# Patient Record
Sex: Female | Born: 1960 | Hispanic: No | Marital: Married | Smoking: Former smoker
Health system: Southern US, Community
[De-identification: ages and names within clinical notes are randomized; demographics above are authoritative.]

## PROBLEM LIST (undated history)

## (undated) DIAGNOSIS — K219 Gastro-esophageal reflux disease without esophagitis: Secondary | ICD-10-CM

## (undated) HISTORY — PX: UPPER GASTROINTESTINAL ENDOSCOPY: SHX188

---

## 1999-02-21 ENCOUNTER — Other Ambulatory Visit: Admission: RE | Admit: 1999-02-21 | Discharge: 1999-02-21 | Payer: Self-pay | Admitting: Obstetrics and Gynecology

## 1999-02-21 ENCOUNTER — Encounter (INDEPENDENT_AMBULATORY_CARE_PROVIDER_SITE_OTHER): Payer: Self-pay | Admitting: Specialist

## 2000-07-14 ENCOUNTER — Other Ambulatory Visit: Admission: RE | Admit: 2000-07-14 | Discharge: 2000-07-14 | Payer: Self-pay | Admitting: Obstetrics and Gynecology

## 2001-10-26 ENCOUNTER — Other Ambulatory Visit: Admission: RE | Admit: 2001-10-26 | Discharge: 2001-10-26 | Payer: Self-pay | Admitting: Obstetrics and Gynecology

## 2002-12-06 ENCOUNTER — Other Ambulatory Visit: Admission: RE | Admit: 2002-12-06 | Discharge: 2002-12-06 | Payer: Self-pay | Admitting: *Deleted

## 2004-06-18 ENCOUNTER — Other Ambulatory Visit: Admission: RE | Admit: 2004-06-18 | Discharge: 2004-06-18 | Payer: Self-pay | Admitting: Obstetrics and Gynecology

## 2005-07-04 ENCOUNTER — Other Ambulatory Visit: Admission: RE | Admit: 2005-07-04 | Discharge: 2005-07-04 | Payer: Self-pay | Admitting: Obstetrics and Gynecology

## 2006-10-20 ENCOUNTER — Other Ambulatory Visit: Admission: RE | Admit: 2006-10-20 | Discharge: 2006-10-20 | Payer: Self-pay | Admitting: Obstetrics and Gynecology

## 2008-04-21 ENCOUNTER — Other Ambulatory Visit: Admission: RE | Admit: 2008-04-21 | Discharge: 2008-04-21 | Payer: Self-pay | Admitting: Gynecology

## 2008-04-21 ENCOUNTER — Encounter: Payer: Self-pay | Admitting: Women's Health

## 2008-04-21 ENCOUNTER — Ambulatory Visit: Payer: Self-pay | Admitting: Women's Health

## 2009-04-11 ENCOUNTER — Other Ambulatory Visit: Admission: RE | Admit: 2009-04-11 | Discharge: 2009-04-11 | Payer: Self-pay | Admitting: Family Medicine

## 2015-08-23 ENCOUNTER — Emergency Department (HOSPITAL_COMMUNITY): Payer: BLUE CROSS/BLUE SHIELD

## 2015-08-23 ENCOUNTER — Encounter (HOSPITAL_COMMUNITY): Payer: Self-pay

## 2015-08-23 ENCOUNTER — Emergency Department (HOSPITAL_COMMUNITY)
Admission: EM | Admit: 2015-08-23 | Discharge: 2015-08-23 | Disposition: A | Discharge status: Home / Self Care | Payer: BLUE CROSS/BLUE SHIELD | Attending: Emergency Medicine | Admitting: Emergency Medicine

## 2015-08-23 DIAGNOSIS — R079 Chest pain, unspecified: Secondary | ICD-10-CM | POA: Diagnosis not present

## 2015-08-23 DIAGNOSIS — Z87891 Personal history of nicotine dependence: Secondary | ICD-10-CM | POA: Insufficient documentation

## 2015-08-23 DIAGNOSIS — K219 Gastro-esophageal reflux disease without esophagitis: Secondary | ICD-10-CM | POA: Diagnosis not present

## 2015-08-23 HISTORY — DX: Gastro-esophageal reflux disease without esophagitis: K21.9

## 2015-08-23 LAB — CBC
HEMATOCRIT: 42.4 % (ref 36.0–46.0)
HEMOGLOBIN: 14.4 g/dL (ref 12.0–15.0)
MCH: 29 pg (ref 26.0–34.0)
MCHC: 34 g/dL (ref 30.0–36.0)
MCV: 85.5 fL (ref 78.0–100.0)
PLATELETS: 214 10*3/uL (ref 150–400)
RBC: 4.96 MIL/uL (ref 3.87–5.11)
RDW: 13.6 % (ref 11.5–15.5)
WBC: 7.1 10*3/uL (ref 4.0–10.5)

## 2015-08-23 LAB — BASIC METABOLIC PANEL
ANION GAP: 10 (ref 5–15)
BUN: 15 mg/dL (ref 6–20)
CO2: 25 mmol/L (ref 22–32)
Calcium: 9.8 mg/dL (ref 8.9–10.3)
Chloride: 105 mmol/L (ref 101–111)
Creatinine, Ser: 0.78 mg/dL (ref 0.44–1.00)
GFR calc Af Amer: >60 mL/min (ref >60)
GFR calc non Af Amer: >60 mL/min (ref >60)
GLUCOSE: 83 mg/dL (ref 65–99)
POTASSIUM: 3.7 mmol/L (ref 3.5–5.1)
Sodium: 140 mmol/L (ref 135–145)

## 2015-08-23 LAB — I-STAT TROPONIN, ED: Troponin i, poc: 0 ng/mL (ref 0.00–0.08)

## 2015-08-23 MED ORDER — GI COCKTAIL ~~LOC~~
30.0000 mL | Freq: Once | ORAL | Status: AC
  Administered 2015-08-23: 30 mL via ORAL
  Filled 2015-08-23: qty 30

## 2015-08-23 MED ORDER — PANTOPRAZOLE SODIUM 20 MG PO TBEC: 20.0000 mg | DELAYED_RELEASE_TABLET | Freq: Every day | ORAL | Status: DC

## 2015-08-23 MED ORDER — FAMOTIDINE 20 MG PO TABS
20.0000 mg | ORAL_TABLET | Freq: Once | ORAL | Status: AC
  Administered 2015-08-23: 20 mg via ORAL
  Filled 2015-08-23: qty 1

## 2015-08-23 MED ORDER — PANTOPRAZOLE SODIUM 40 MG PO TBEC
40.0000 mg | DELAYED_RELEASE_TABLET | Freq: Once | ORAL | Status: AC
  Administered 2015-08-23: 40 mg via ORAL
  Filled 2015-08-23: qty 1

## 2015-08-23 NOTE — ED Provider Notes (Signed)
CSN: [id number]     Arrival date & time 08/23/15  1738 History   First MD Initiated Contact with Patient 08/23/15 1941     Chief Complaint  Patient presents with  . Chest Pain     (Consider location/radiation/quality/duration/timing/severity/associated sxs/prior Treatment) HPI Patient awoke with chest pain this morning. She reports is in the center of her chest and comes up to her upper back. As been constant throughout the day. Patient has had similar pain occasionally. It is usually upon waking in the morning. Usually goes away once she is up. He does note this pain is worse with lying supine. A and takes Tums as needed. She has however had some significant problems with GERD and required esophageal dilation. Lower extremity pain or swelling. Patient does not smoke or drink alcohol. Mother had early onset of heart disease in her 44s or 68s. She did not die of heart disease. Patient has no history of hypertension or diabetes. Past Medical History  Diagnosis Date  . GERD (gastroesophageal reflux disease)    History reviewed. No pertinent past surgical history. History reviewed. No pertinent family history. Social History  Substance Use Topics  . Smoking status: Former Research scientist (life sciences)  . Smokeless tobacco: None  . Alcohol Use: No   OB History    No data available     Review of Systems  10 Systems reviewed and are negative for acute change except as noted in the HPI.   Allergies  Review of patient's allergies indicates no known allergies.  Home Medications   Prior to Admission medications   Medication Sig Start Date End Date Taking? Authorizing Provider  calcium carbonate (TUMS - DOSED IN MG ELEMENTAL CALCIUM) 500 MG chewable tablet Chew 1 tablet by mouth daily as needed for indigestion or heartburn (chest pain).   Yes Historical Provider, MD  ibuprofen (ADVIL,MOTRIN) 200 MG tablet Take 200 mg by mouth every 6 (six) hours as needed (chest pain).   Yes Historical Provider, MD   pantoprazole (PROTONIX) 20 MG tablet Take 1 tablet (20 mg total) by mouth daily. 08/23/15   Charlesetta Shanks, MD   BP 126/61 mmHg  Pulse 64  Temp(Src) 98 F (36.7 C) (Oral)  Resp 18  SpO2 98% Physical Exam  Constitutional: She is oriented to person, place, and time. She appears well-developed and well-nourished.  HENT:  Head: Normocephalic and atraumatic.  Eyes: EOM are normal. Pupils are equal, round, and reactive to light.  Neck: Neck supple.  Cardiovascular: Normal rate, regular rhythm, normal heart sounds and intact distal pulses.   Pulmonary/Chest: Effort normal and breath sounds normal.  Abdominal: Soft. Bowel sounds are normal. She exhibits no distension. There is no tenderness.  Musculoskeletal: Normal range of motion. She exhibits no edema or tenderness.  Neurological: She is alert and oriented to person, place, and time. She has normal strength. Coordination normal. GCS eye subscore is 4. GCS verbal subscore is 5. GCS motor subscore is 6.  Skin: Skin is warm, dry and intact.  Psychiatric: She has a normal mood and affect.    ED Course  Procedures (including critical care time) Labs Review Labs Reviewed  BASIC METABOLIC PANEL  CBC  I-STAT [place], ED    Imaging Review Dg Chest 2 View  08/23/2015  CLINICAL DATA:  Chest pain began today. EXAM: CHEST  2 VIEW COMPARISON:  None. FINDINGS: Borderline prominence of upper zone pulmonary vasculature but no enlargement of the cardiopericardial silhouette to further suggest pulmonary venous hypertension. The lungs appear otherwise clear.  No pleural effusion. IMPRESSION: 1.  No significant abnormality identified. Electronically Signed   By: Van Clines M.D.   On: 08/23/2015 19:08   I have personally reviewed and evaluated these images and lab results as part of my medical decision-making.   EKG Interpretation   Date/Time:  Wednesday August 23 2015 17:57:15 EDT Ventricular Rate:  65 PR Interval:  122 QRS Duration: 84 QT  Interval:  387 QTC Calculation: 402 R Axis:   64 Text Interpretation:  Sinus rhythm Probable anteroseptal infarct, old poor  r wave progression Confirmed by Johnney Killian, MD, Jeannie Done [phone]) on 08/23/2015  8:39:22 PM      MDM   Final diagnoses:  Chest pain, unspecified chest pain type   A presents with chest pain has been present all day. At this time symptoms are suggestive of reflux. She awakened with the discomfort and it is worsened by lying supine. Physical examination is normal. There is no associated symptoms. Patient does have a significant history of reflux and having had esophageal dilation. She has minimal risk factors aside from family history. Patient will be started on daily Protonix with recommendations for follow-up with cardiology as an outpatient. She is also counseled to establish care with a primary provider. Signs and symptoms for which to return are reviewed    Charlesetta Shanks, MD 08/23/15 2100

## 2015-08-23 NOTE — ED Notes (Signed)
Pt with chest pain starting when waking up this morning.  Comes and goes.  At times in upper back.  Pt denies cough/congestion.  No recent increase in physical activity.  HX of GERD but only takes 1. Took 1 tums today with some slight relief and some relief with tylenol.

## 2015-08-23 NOTE — Discharge Instructions (Signed)
Nonspecific Chest Pain  °Chest pain can be caused by many different conditions. There is always a chance that your pain could be related to something serious, such as a heart attack or a blood clot in your lungs. Chest pain can also be caused by conditions that are not life-threatening. If you have chest pain, it is very important to follow up with your health care provider. °CAUSES  °Chest pain can be caused by: °· Heartburn. °· Pneumonia or bronchitis. °· Anxiety or stress. °· Inflammation around your heart (pericarditis) or lung (pleuritis or pleurisy). °· A blood clot in your lung. °· A collapsed lung (pneumothorax). It can develop suddenly on its own (spontaneous pneumothorax) or from trauma to the chest. °· Shingles infection (varicella-zoster virus). °· Heart attack. °· Damage to the bones, muscles, and cartilage that make up your chest wall. This can include: °· Bruised bones due to injury. °· Strained muscles or cartilage due to frequent or repeated coughing or overwork. °· Fracture to one or more ribs. °· Sore cartilage due to inflammation (costochondritis). °RISK FACTORS  °Risk factors for chest pain may include: °· Activities that increase your risk for trauma or injury to your chest. °· Respiratory infections or conditions that cause frequent coughing. °· Medical conditions or overeating that can cause heartburn. °· Heart disease or family history of heart disease. °· Conditions or health behaviors that increase your risk of developing a blood clot. °· Having had chicken pox (varicella zoster). °SIGNS AND SYMPTOMS °Chest pain can feel like: °· Burning or tingling on the surface of your chest or deep in your chest. °· Crushing, pressure, aching, or squeezing pain. °· Dull or sharp pain that is worse when you move, cough, or take a deep breath. °· Pain that is also felt in your back, neck, shoulder, or arm, or pain that spreads to any of these areas. °Your chest pain may come and go, or it may stay  constant. °DIAGNOSIS °Lab tests or other studies may be needed to find the cause of your pain. Your health care provider may have you take a test called an ambulatory ECG (electrocardiogram). An ECG records your heartbeat patterns at the time the test is performed. You may also have other tests, such as: °· Transthoracic echocardiogram (TTE). During echocardiography, sound waves are used to create a picture of all of the heart structures and to look at how blood flows through your heart. °· Transesophageal echocardiogram (TEE). This is a more advanced imaging test that obtains images from inside your body. It allows your health care provider to see your heart in finer detail. °· Cardiac monitoring. This allows your health care provider to monitor your heart rate and rhythm in real time. °· Holter monitor. This is a portable device that records your heartbeat and can help to diagnose abnormal heartbeats. It allows your health care provider to track your heart activity for several days, if needed. °· Stress tests. These can be done through exercise or by taking medicine that makes your heart beat more quickly. °· Blood tests. °· Imaging tests. °TREATMENT  °Your treatment depends on what is causing your chest pain. Treatment may include: °· Medicines. These may include: °· Acid blockers for heartburn. °· Anti-inflammatory medicine. °· Pain medicine for inflammatory conditions. °· Antibiotic medicine, if an infection is present. °· Medicines to dissolve blood clots. °· Medicines to treat coronary artery disease. °· Supportive care for conditions that do not require medicines. This may include: °· Resting. °· Applying heat   or cold packs to injured areas. °· Limiting activities until pain decreases. °HOME CARE INSTRUCTIONS °· If you were prescribed an antibiotic medicine, finish it all even if you start to feel better. °· Avoid any activities that bring on chest pain. °· Do not use any tobacco products, including  cigarettes, chewing tobacco, or electronic cigarettes. If you need help quitting, ask your health care provider. °· Do not drink alcohol. °· Take medicines only as directed by your health care provider. °· Keep all follow-up visits as directed by your health care provider. This is important. This includes any further testing if your chest pain does not go away. °· If heartburn is the cause for your chest pain, you may be told to keep your head raised (elevated) while sleeping. This reduces the chance that acid will go from your stomach into your esophagus. °· Make lifestyle changes as directed by your health care provider. These may include: °¨ Getting regular exercise. Ask your health care provider to suggest some activities that are safe for you. °¨ Eating a heart-healthy diet. A registered dietitian can help you to learn healthy eating options. °¨ Maintaining a healthy weight. °¨ Managing diabetes, if necessary. °¨ Reducing stress. °SEEK MEDICAL CARE IF: °· Your chest pain does not go away after treatment. °· You have a rash with blisters on your chest. °· You have a fever. °SEEK IMMEDIATE MEDICAL CARE IF:  °· Your chest pain is worse. °· You have an increasing cough, or you cough up blood. °· You have severe abdominal pain. °· You have severe weakness. °· You faint. °· You have chills. °· You have sudden, unexplained chest discomfort. °· You have sudden, unexplained discomfort in your arms, back, neck, or jaw. °· You have shortness of breath at any time. °· You suddenly start to sweat, or your skin gets clammy. °· You feel nauseous or you vomit. °· You suddenly feel light-headed or dizzy. °· Your heart begins to beat quickly, or it feels like it is skipping beats. °These symptoms may represent a serious problem that is an emergency. Do not wait to see if the symptoms will go away. Get medical help right away. Call your local emergency services (911 in the [place] Do not drive yourself to the hospital. °  °This  information is not intended to replace advice given to you by your health care provider. Make sure you discuss any questions you have with your health care provider. °  °Document Released: 02/13/2005 Document Revised: 05/27/2014 Document Reviewed: 12/10/2013 °Elsevier Interactive Patient Education ©2016 [employer] °Suspected Gastroesophageal Reflux Disease, Adult °Normally, food travels down the esophagus and stays in the stomach to be digested. However, when a person has gastroesophageal reflux disease (GERD), food and stomach acid move back up into the esophagus. When this happens, the esophagus becomes sore and inflamed. Over time, GERD can create small holes (ulcers) in the lining of the esophagus.  °CAUSES °This condition is caused by a problem with the muscle between the esophagus and the stomach (lower esophageal sphincter, or LES). Normally, the LES muscle closes after food passes through the esophagus to the stomach. When the LES is weakened or abnormal, it does not close properly, and that allows food and stomach acid to go back up into the esophagus. The LES can be weakened by certain dietary substances, medicines, and medical conditions, including: °· Tobacco use. °· Pregnancy. °· Having a hiatal hernia. °· Heavy alcohol use. °· Certain foods and beverages, such as coffee,   chocolate, onions, and peppermint. °RISK FACTORS °This condition is more likely to develop in: °· People who have an increased body weight. °· People who have connective tissue disorders. °· People who use NSAID medicines. °SYMPTOMS °Symptoms of this condition include: °· Heartburn. °· Difficult or painful swallowing. °· The feeling of having a lump in the throat. °· A bitter taste in the mouth. °· Bad breath. °· Having a large amount of saliva. °· Having an upset or bloated stomach. °· Belching. °· Chest pain. °· Shortness of breath or wheezing. °· Ongoing (chronic) cough or a night-time cough. °· Wearing away of tooth  enamel. °· Weight loss. °Different conditions can cause chest pain. Make sure to see your health care provider if you experience chest pain. °DIAGNOSIS °Your health care provider will take a medical history and perform a physical exam. To determine if you have mild or severe GERD, your health care provider may also monitor how you respond to treatment. You may also have other tests, including: °· An endoscopy to examine your stomach and esophagus with a small camera. °· A test that measures the acidity level in your esophagus. °· A test that measures how much pressure is on your esophagus. °· A barium swallow or modified barium swallow to show the shape, size, and functioning of your esophagus. °TREATMENT °The goal of treatment is to help relieve your symptoms and to prevent complications. Treatment for this condition may vary depending on how severe your symptoms are. Your health care provider may recommend: °· Changes to your diet. °· Medicine. °· Surgery. °HOME CARE INSTRUCTIONS °Diet °· Follow a diet as recommended by your health care provider. This may involve avoiding foods and drinks such as: °¨ Coffee and tea (with or without caffeine). °¨ Drinks that contain alcohol. °¨ Energy drinks and sports drinks. °¨ Carbonated drinks or sodas. °¨ Chocolate and cocoa. °¨ Peppermint and mint flavorings. °¨ Garlic and onions. °¨ Horseradish. °¨ Spicy and acidic foods, including peppers, chili powder, curry powder, vinegar, hot sauces, and barbecue sauce. °¨ Citrus fruit juices and citrus fruits, such as oranges, lemons, and limes. °¨ Tomato-based foods, such as red sauce, chili, salsa, and pizza with red sauce. °¨ Fried and fatty foods, such as donuts, french fries, potato chips, and high-fat dressings. °¨ High-fat meats, such as hot dogs and fatty cuts of red and white meats, such as rib eye steak, sausage, ham, and bacon. °¨ High-fat dairy items, such as whole milk, butter, and cream cheese. °· Eat small, frequent  meals instead of large meals. °· Avoid drinking large amounts of liquid with your meals. °· Avoid eating meals during the 2-3 hours before bedtime. °· Avoid lying down right after you eat. °· Do not exercise right after you eat. ° General Instructions  °· Pay attention to any changes in your symptoms. °· Take over-the-counter and prescription medicines only as told by your health care provider. Do not take aspirin, ibuprofen, or other NSAIDs unless your health care provider told you to do so. °· Do not use any tobacco products, including cigarettes, chewing tobacco, and e-cigarettes. If you need help quitting, ask your health care provider. °· Wear loose-fitting clothing. Do not wear anything tight around your waist that causes pressure on your abdomen. °· Raise (elevate) the head of your bed 6 inches (15cm). °· Try to reduce your stress, such as with yoga or meditation. If you need help reducing stress, ask your health care provider. °· If you are overweight, reduce your weight to an amount that is   healthy for you. Ask your health care provider for guidance about a safe weight loss goal. °· Keep all follow-up visits as told by your health care provider. This is important. °SEEK MEDICAL CARE IF: °· You have new symptoms. °· You have unexplained weight loss. °· You have difficulty swallowing, or it hurts to swallow. °· You have wheezing or a persistent cough. °· Your symptoms do not improve with treatment. °· You have a hoarse voice. °SEEK IMMEDIATE MEDICAL CARE IF: °· You have pain in your arms, neck, jaw, teeth, or back. °· You feel sweaty, dizzy, or light-headed. °· You have chest pain or shortness of breath. °· You vomit and your vomit looks like blood or coffee grounds. °· You faint. °· Your stool is bloody or black. °· You cannot swallow, drink, or eat. °  °This information is not intended to replace advice given to you by your health care provider. Make sure you discuss any questions you have with your health  care provider. °  °Document Released: 02/13/2005 Document Revised: 01/25/2015 Document Reviewed: 08/31/2014 °Elsevier Interactive Patient Education ©2016 [employer] ° °

## 2019-07-01 ENCOUNTER — Encounter: Payer: Self-pay | Admitting: Family Medicine

## 2020-08-04 ENCOUNTER — Encounter: Payer: Self-pay | Admitting: Gastroenterology

## 2020-09-07 ENCOUNTER — Other Ambulatory Visit: Payer: Self-pay | Admitting: Obstetrics & Gynecology

## 2020-09-07 DIAGNOSIS — R928 Other abnormal and inconclusive findings on diagnostic imaging of breast: Secondary | ICD-10-CM

## 2020-09-22 ENCOUNTER — Encounter: Payer: BLUE CROSS/BLUE SHIELD | Admitting: Gastroenterology

## 2020-09-29 ENCOUNTER — Other Ambulatory Visit: Payer: Self-pay

## 2020-09-29 ENCOUNTER — Ambulatory Visit
Admission: RE | Admit: 2020-09-29 | Discharge: 2020-09-29 | Disposition: A | Discharge status: Home / Self Care | Payer: PRIVATE HEALTH INSURANCE | Source: Ambulatory Visit | Attending: Obstetrics & Gynecology | Admitting: Obstetrics & Gynecology

## 2020-09-29 DIAGNOSIS — R928 Other abnormal and inconclusive findings on diagnostic imaging of breast: Secondary | ICD-10-CM

## 2020-11-03 ENCOUNTER — Ambulatory Visit (AMBULATORY_SURGERY_CENTER): Payer: BC Managed Care – PPO | Admitting: *Deleted

## 2020-11-03 ENCOUNTER — Other Ambulatory Visit: Payer: Self-pay

## 2020-11-03 VITALS — Ht 65.0 in | Wt 168.0 lb

## 2020-11-03 DIAGNOSIS — Z1211 Encounter for screening for malignant neoplasm of colon: Secondary | ICD-10-CM

## 2020-11-03 NOTE — Progress Notes (Signed)
Patient's pre-visit was done today over the phone with the patient due to COVID-19 pandemic. Name,DOB and address verified. Insurance verified. Patient denies any allergies to Eggs and Soy. Patient denies any problems with sedation. Patient denies taking diet pills or blood thinners. Packet of Prep instructions mailed to patient including a copy of a consent form-pt is aware. Patient understands to call us back with any questions or concerns. Patient is aware of our care-partner policy and [facility] safety protocol. The patient is COVID-19 vaccinated, per patient.

## 2020-11-15 ENCOUNTER — Encounter: Payer: Self-pay | Admitting: Gastroenterology

## 2020-11-17 ENCOUNTER — Other Ambulatory Visit: Payer: Self-pay

## 2020-11-17 ENCOUNTER — Encounter: Payer: Self-pay | Admitting: Gastroenterology

## 2020-11-17 ENCOUNTER — Ambulatory Visit (AMBULATORY_SURGERY_CENTER): Payer: BC Managed Care – PPO | Admitting: Gastroenterology

## 2020-11-17 VITALS — BP 133/71 | HR 74 | Temp 97.8°F | Resp 16 | Ht 65.0 in | Wt 168.0 lb

## 2020-11-17 DIAGNOSIS — K635 Polyp of colon: Secondary | ICD-10-CM

## 2020-11-17 DIAGNOSIS — K621 Rectal polyp: Secondary | ICD-10-CM

## 2020-11-17 DIAGNOSIS — D125 Benign neoplasm of sigmoid colon: Secondary | ICD-10-CM

## 2020-11-17 DIAGNOSIS — Z1211 Encounter for screening for malignant neoplasm of colon: Secondary | ICD-10-CM | POA: Diagnosis present

## 2020-11-17 DIAGNOSIS — D128 Benign neoplasm of rectum: Secondary | ICD-10-CM

## 2020-11-17 DIAGNOSIS — D127 Benign neoplasm of rectosigmoid junction: Secondary | ICD-10-CM

## 2020-11-17 MED ORDER — SODIUM CHLORIDE 0.9 % IV SOLN: 500.0000 mL | Freq: Once | INTRAVENOUS | Status: DC

## 2020-11-17 NOTE — Progress Notes (Signed)
[facility]  Pt's states no medical or surgical changes since previsit or office visit.  

## 2020-11-17 NOTE — Op Note (Signed)
[facility] Patient Name: Heather Huber Procedure Date: 11/17/2020 10:12 AM MRN: [id number] Endoscopist: Milus Banister , MD Age: 60 Referring MD:  Date of Birth: Oct 31, 1960 Gender: Female Account #: [id number] Procedure:                Colonoscopy Indications:              Screening for colorectal malignant neoplasm Medicines:                Monitored Anesthesia Care Procedure:                Pre-Anesthesia Assessment:                           - Prior to the procedure, a History and Physical                            was performed, and patient medications and                            allergies were reviewed. The patient's tolerance of                            previous anesthesia was also reviewed. The risks                            and benefits of the procedure and the sedation                            options and risks were discussed with the patient.                            All questions were answered, and informed consent                            was obtained. Prior Anticoagulants: The patient has                            taken no previous anticoagulant or antiplatelet                            agents. ASA Grade Assessment: II - A patient with                            mild systemic disease. After reviewing the risks                            and benefits, the patient was deemed in                            satisfactory condition to undergo the procedure.                           After obtaining informed consent, the colonoscope  was passed under direct vision. Throughout the                            procedure, the patient's blood pressure, pulse, and                            oxygen saturations were monitored continuously. The                            CF [id number] #[id number] was introduced through the anus                            and advanced to the the cecum, identified by                            appendiceal orifice  and ileocecal valve. The                            colonoscopy was performed without difficulty. The                            patient tolerated the procedure well. The quality                            of the bowel preparation was good. The ileocecal                            valve, appendiceal orifice, and rectum were                            photographed. Scope In: 10:32:13 AM Scope Out: 10:43:06 AM Scope Withdrawal Time: 0 hours 8 minutes 30 seconds  Total Procedure Duration: 0 hours 10 minutes 53 seconds  Findings:                 Two sessile polyps were found in the rectum and                            sigmoid colon. The polyps were 2 to 3 mm in size.                            These polyps were removed with a cold snare.                            Resection and retrieval were complete.                           The exam was otherwise without abnormality on                            direct and retroflexion views. Complications:            No immediate complications. Estimated blood loss:  None. Estimated Blood Loss:     Estimated blood loss: none. Impression:               - Two 2 to 3 mm polyps in the rectum and in the                            sigmoid colon, removed with a cold snare. Resected                            and retrieved.                           - The examination was otherwise normal on direct                            and retroflexion views. Recommendation:           - Patient has a contact number available for                            emergencies. The signs and symptoms of potential                            delayed complications were discussed with the                            patient. Return to normal activities tomorrow.                            Written discharge instructions were provided to the                            patient.                           - Resume previous diet.                           -  Continue present medications.                           - Await pathology results. Milus Banister, MD 11/17/2020 10:50:54 AM This report has been signed electronically.

## 2020-11-17 NOTE — Progress Notes (Signed)
Report to PACU, RN, vss, BBS= Clear.  

## 2020-11-17 NOTE — Progress Notes (Signed)
Called to room to assist during endoscopic procedure.  Patient ID and intended procedure confirmed with present staff. Received instructions for my participation in the procedure from the performing physician.  

## 2020-11-17 NOTE — Patient Instructions (Signed)
YOU HAD AN ENDOSCOPIC PROCEDURE TODAY AT THE [facility]:   Refer to the procedure report that was given to you for any specific questions about what was found during the examination.  If the procedure report does not answer your questions, please call your gastroenterologist to clarify.  If you requested that your care partner not be given the details of your procedure findings, then the procedure report has been included in a sealed envelope for you to review at your convenience later.  YOU SHOULD EXPECT: Some feelings of bloating in the abdomen. Passage of more gas than usual.  Walking can help get rid of the air that was put into your GI tract during the procedure and reduce the bloating. If you had a lower endoscopy (such as a colonoscopy or flexible sigmoidoscopy) you may notice spotting of blood in your stool or on the toilet paper. If you underwent a bowel prep for your procedure, you may not have a normal bowel movement for a few days.  Please Note:  You might notice some irritation and congestion in your nose or some drainage.  This is from the oxygen used during your procedure.  There is no need for concern and it should clear up in a day or so.  SYMPTOMS TO REPORT IMMEDIATELY:  Following lower endoscopy (colonoscopy or flexible sigmoidoscopy):  Excessive amounts of blood in the stool  Significant tenderness or worsening of abdominal pains  Swelling of the abdomen that is new, acute  Fever of 100F or higher    For urgent or emergent issues, a gastroenterologist can be reached at any hour by calling [phone]. Do not use MyChart messaging for urgent concerns.    DIET:  We do recommend a small meal at first, but then you may proceed to your regular diet.  Drink plenty of fluids but you should avoid alcoholic beverages for 24 hours.  ACTIVITY:  You should plan to take it easy for the rest of today and you should NOT DRIVE or use heavy machinery until tomorrow (because  of the sedation medicines used during the test).    FOLLOW UP: Our staff will call the number listed on your records 48-72 hours following your procedure to check on you and address any questions or concerns that you may have regarding the information given to you following your procedure. If we do not reach you, we will leave a message.  We will attempt to reach you two times.  During this call, we will ask if you have developed any symptoms of COVID 19. If you develop any symptoms (ie: fever, flu-like symptoms, shortness of breath, cough etc.) before then, please call [phone].  If you test positive for Covid 19 in the 2 weeks post procedure, please call and report this information to us.    If any biopsies were taken you will be contacted by phone or by letter within the next 1-3 weeks.  Please call us at [phone] if you have not heard about the biopsies in 3 weeks.    SIGNATURES/CONFIDENTIALITY: You and/or your care partner have signed paperwork which will be entered into your electronic medical record.  These signatures attest to the fact that that the information above on your After Visit Summary has been reviewed and is understood.  Full responsibility of the confidentiality of this discharge information lies with you and/or your care-partner.    Resume medications. Information given on polyps. 

## 2020-11-22 ENCOUNTER — Telehealth: Payer: Self-pay

## 2020-11-22 NOTE — Telephone Encounter (Signed)
Left message on answering machine. 

## 2020-11-27 ENCOUNTER — Encounter: Payer: Self-pay | Admitting: Gastroenterology

## 2022-01-01 IMAGING — MG MM DIGITAL DIAGNOSTIC UNILAT*L* W/ TOMO W/ CAD
4 series · 4 of 12 positions shown · non-contrast
Comparison: Previous exam(s).

CLINICAL DATA: Screening recall for a possible left breast mass.

EXAM:
DIGITAL DIAGNOSTIC UNILATERAL LEFT MAMMOGRAM WITH TOMOSYNTHESIS AND
CAD; ULTRASOUND LEFT BREAST LIMITED
TECHNIQUE: Left digital diagnostic mammography and breast tomosynthesis was
performed. The images were evaluated with computer-aided detection.;
Targeted ultrasound examination of the left breast was performed

[L CC synth-2D]
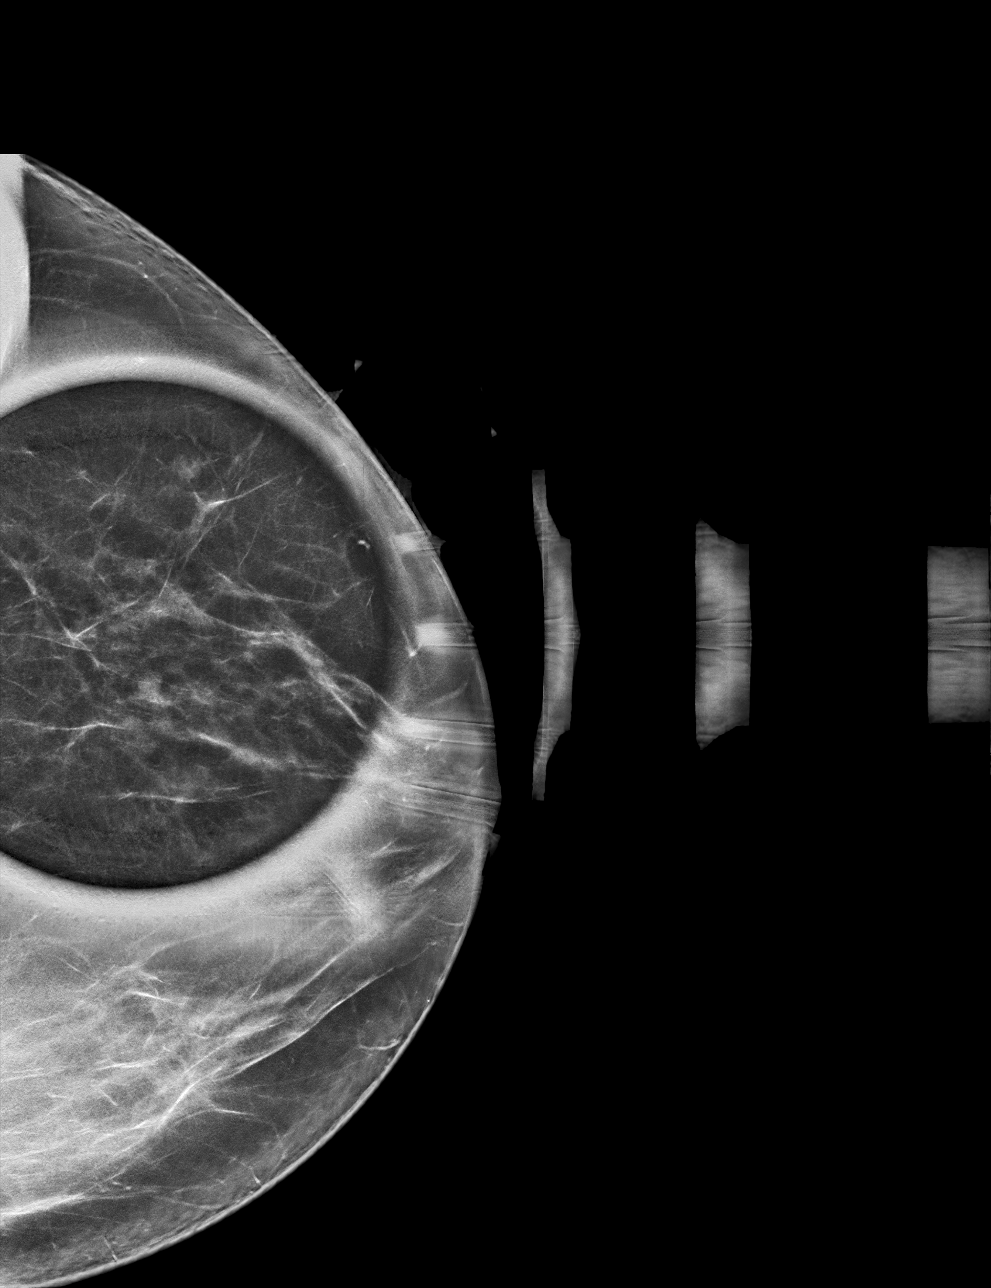

[L MLO synth-2D]
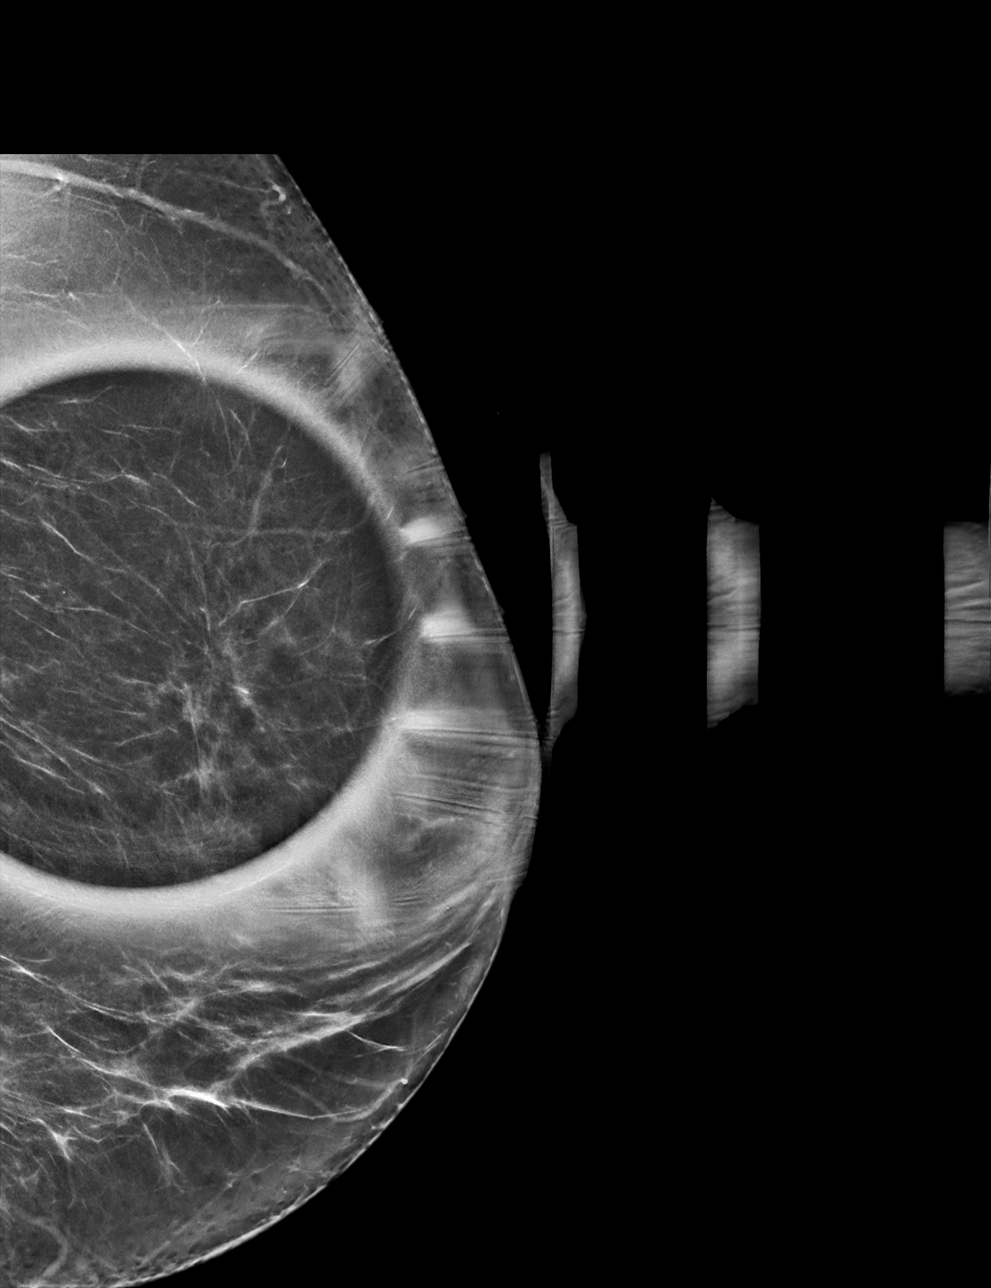

[L MLO tomo · tomo slice 37/73.0]
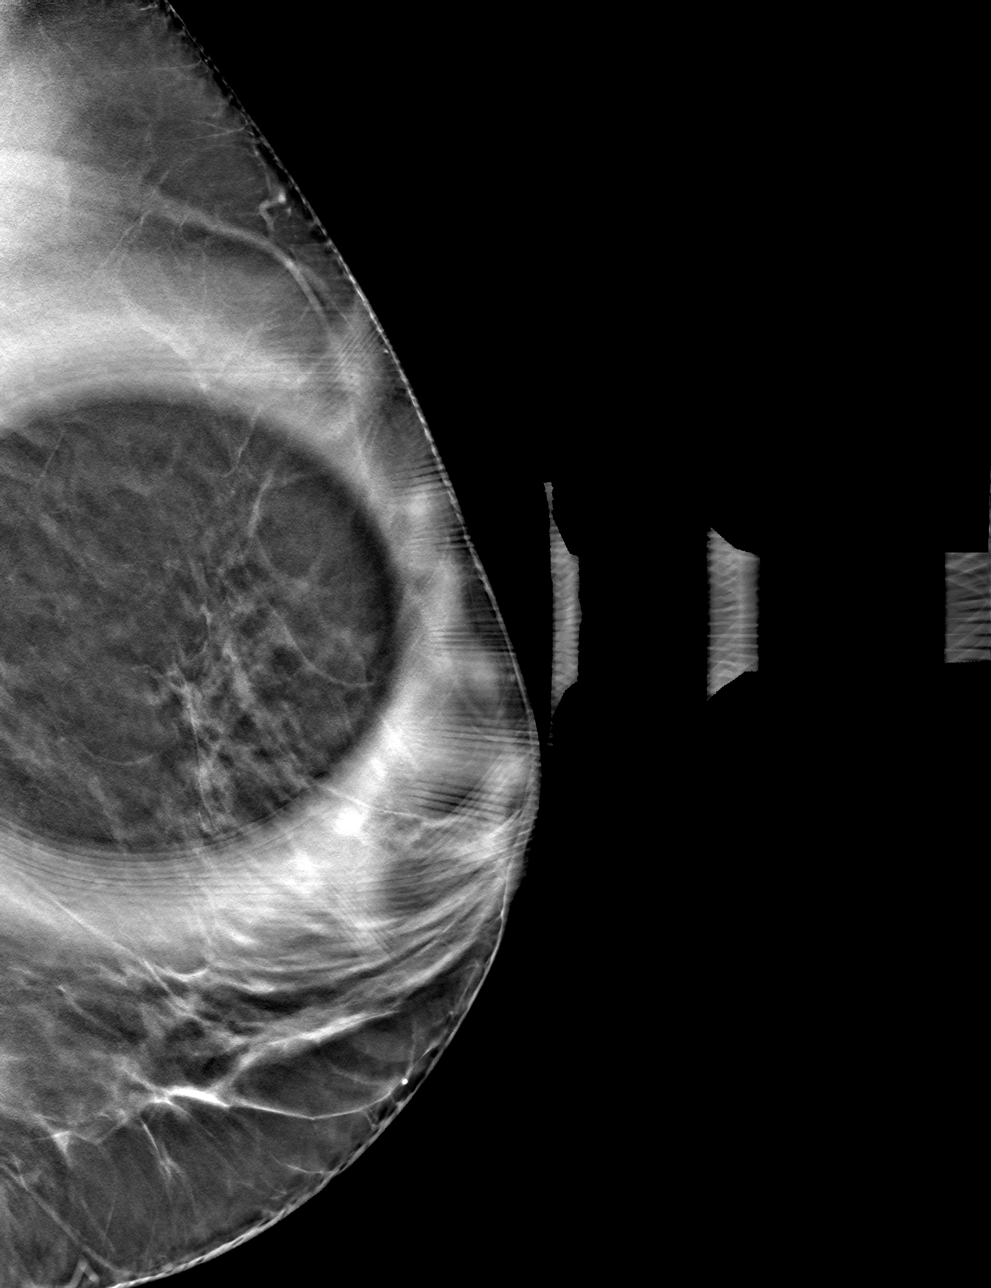

[L CC tomo · tomo slice 33/64.0]
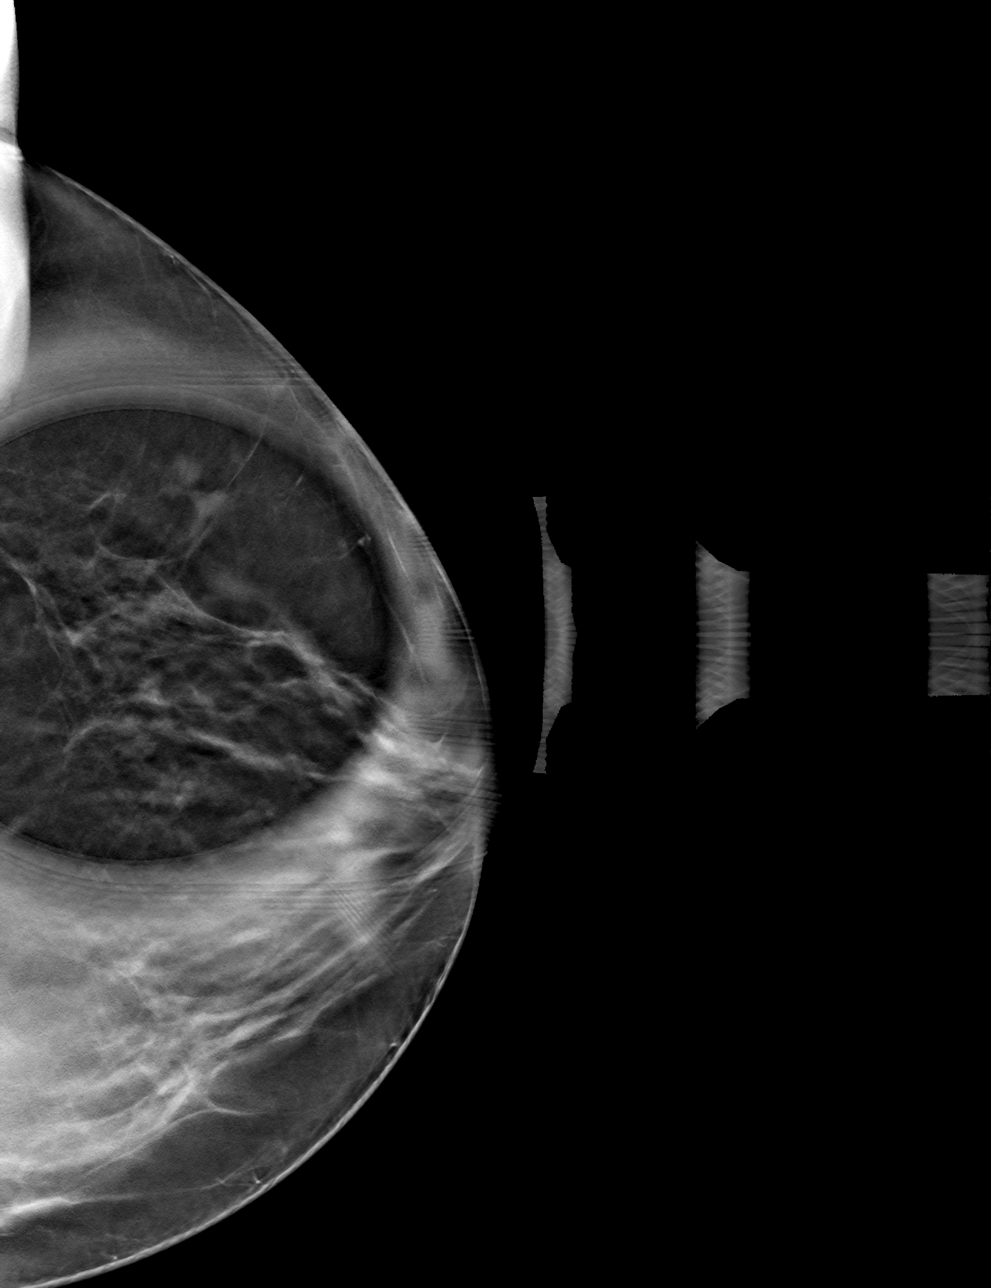

[4 of 12 positions shown; findings below may reference images not displayed]

ACR Breast Density Category b: There are scattered areas of
fibroglandular density.
FINDINGS: Spot compression tomosynthesis images reveal a persistent oval
circumscribed mass in the lateral aspect of the left breast, middle
depth. The mass measures approximately 5 mm.

Ultrasound targeted to the left breast at 3 o'clock demonstrates a
few small benign-appearing cysts. There is a cyst at 3 o'clock, 4 cm
from the nipple measuring 5 x 3 x 4 mm, likely corresponding with
the mass identified mammographically. At 3 o'clock, 1 cm from the
nipple, there is a 4 mm benign-appearing cyst.
IMPRESSION: The mass in the left breast corresponds with a benign cyst.

RECOMMENDATION:
Screening mammogram in one year.(Code:ZF-[id number])

I have discussed the findings and recommendations with the patient.
If applicable, a reminder letter will be sent to the patient
regarding the next appointment.

BI-RADS CATEGORY  2: Benign.

## 2022-09-05 ENCOUNTER — Encounter: Payer: Self-pay | Admitting: Pulmonary Disease

## 2022-09-05 ENCOUNTER — Ambulatory Visit: Payer: No Typology Code available for payment source | Admitting: Pulmonary Disease

## 2022-09-05 VITALS — BP 124/72 | HR 72 | Ht 65.0 in | Wt 172.0 lb

## 2022-09-05 DIAGNOSIS — G478 Other sleep disorders: Secondary | ICD-10-CM

## 2022-09-05 NOTE — Patient Instructions (Signed)
Next step in evaluation will be a sleep study  --This is about the only way that we know whether you have significant sleep apnea or any significant sleep apnea or just snoring  Weight loss, regular exercises will help number of events and help sleep quality as well  Call us with significant concerns  Tentative follow-up in 6 months to reassess  Living With Sleep Apnea Sleep apnea is a condition in which breathing pauses or becomes shallow during sleep. Sleep apnea is most commonly caused by a collapsed or blocked airway. People with sleep apnea usually snore loudly. They may have times when they gasp and stop breathing for 10 seconds or more during sleep. This may happen many times during the night. The breaks in breathing also interrupt the deep sleep that you need to feel rested. Even if you do not completely wake up from the gaps in breathing, your sleep may not be restful and you feel tired during the day. You may also have a headache in the morning and low energy during the day, and you may feel anxious or depressed. How can sleep apnea affect me? Sleep apnea increases your chances of extreme tiredness during the day (daytime fatigue). It can also increase your risk for health conditions, such as: Heart attack. Stroke. Obesity. Type 2 diabetes. Heart failure. Irregular heartbeat. High blood pressure. If you have daytime fatigue as a result of sleep apnea, you may be more likely to: Perform poorly at school or work. Fall asleep while driving. Have difficulty with attention. Develop depression or anxiety. Have sexual dysfunction. What actions can I take to manage sleep apnea? Sleep apnea treatment  If you were given a device to open your airway while you sleep, use it only as told by your health care provider. You may be given: An oral appliance. This is a custom-made mouthpiece that shifts your lower jaw forward. A continuous positive airway pressure (CPAP) device. This device  blows air through a mask when you breathe out (exhale). A nasal expiratory positive airway pressure (EPAP) device. This device has valves that you put into each nostril. A bi-level positive airway pressure (BIPAP) device. This device blows air through a mask when you breathe in (inhale) and breathe out (exhale). You may need surgery if other treatments do not work for you. Sleep habits Go to sleep and wake up at the same time every day. This helps set your internal clock (circadian rhythm) for sleeping. If you stay up later than usual, such as on weekends, try to get up in the morning within 2 hours of your normal wake time. Try to get at least 7-9 hours of sleep each night. Stop using a computer, tablet, and mobile phone a few hours before bedtime. Do not take long naps during the day. If you nap, limit it to 30 minutes. Have a relaxing bedtime routine. Reading or listening to music may relax you and help you sleep. Use your bedroom only for sleep. Keep your television and computer out of your bedroom. Keep your bedroom cool, dark, and quiet. Use a supportive mattress and pillows. Follow your health care provider's instructions for other changes to sleep habits. Nutrition Do not eat heavy meals in the evening. Do not have caffeine in the later part of the day. The effects of caffeine can last for more than 5 hours. Follow your health care provider's or dietitian's instructions for any diet changes. Lifestyle     Do not drink alcohol before bedtime. Alcohol  can cause you to fall asleep at first, but then it can cause you to wake up in the middle of the night and have trouble getting back to sleep. Do not use any products that contain nicotine or tobacco. These products include cigarettes, chewing tobacco, and vaping devices, such as e-cigarettes. If you need help quitting, ask your health care provider. Medicines Take over-the-counter and prescription medicines only as told by your health  care provider. Do not use over-the-counter sleep medicine. You can become dependent on this medicine, and it can make sleep apnea worse. Do not use medicines, such as sedatives and narcotics, unless told by your health care provider. Activity Exercise on most days, but avoid exercising in the evening. Exercising near bedtime can interfere with sleeping. If possible, spend time outside every day. Natural light helps regulate your circadian rhythm. General information Lose weight if you need to, and maintain a healthy weight. Keep all follow-up visits. This is important. If you are having surgery, make sure to tell your health care provider that you have sleep apnea. You may need to bring your device with you. Where to find more information Learn more about sleep apnea and daytime fatigue from: American Sleep Association: sleepassociation.org National Sleep Foundation: sleepfoundation.org National Heart, Lung, and Blood Institute: [employer] Summary Sleep apnea is a condition in which breathing pauses or becomes shallow during sleep. Sleep apnea can cause daytime fatigue and other serious health conditions. You may need to wear a device while sleeping to help keep your airway open. If you are having surgery, make sure to tell your health care provider that you have sleep apnea. You may need to bring your device with you. Making changes to sleep habits, diet, lifestyle, and activity can help you manage sleep apnea. This information is not intended to replace advice given to you by your health care provider. Make sure you discuss any questions you have with your health care provider. Document Revised: 12/13/2020 Document Reviewed: 04/14/2020 Elsevier Patient Education  2023 [employer].

## 2022-09-05 NOTE — Progress Notes (Signed)
Heather Huber    [id number]    February 24, 1961  Primary Care Physician:Badger, Kayleen Memos, MD  Referring Physician: Eartha Inch, MD [address] [place],  [place] [address]  Chief complaint:   Patient being seen with concern for multiple awakenings, waking up with anxiety and headaches  HPI:  Known to snore, wakes up feeling rested She does have headaches in the morning Denies significant dryness of her mouth in the mornings  She wakes up sometimes in a panic  Usually goes to bed about 9 PM to 11 PM, takes about 15 minutes to fall asleep 2-3 awakenings Final wake up time between 630 and 730  Weight has been steady maybe about 5 pound weight gain denied denies any choking or gasping respirations at night, she does have a history of reflux and she may have woken up related to reflux at some point recently  Memory is fair  She does get tired sometimes and after waking up but has not had any significant sleepiness during the day No difficulty driving  History of hypercholesterolemia No history of hypertension or heart problems  Quit smoking in 1980   Outpatient Encounter Medications as of 09/05/2022  Medication Sig   calcium carbonate (TUMS - DOSED IN MG ELEMENTAL CALCIUM) 500 MG chewable tablet Chew 1 tablet by mouth daily as needed for indigestion or heartburn (chest pain).   celecoxib (CELEBREX) 200 MG capsule Take 200 mg by mouth daily.   ibuprofen (ADVIL,MOTRIN) 200 MG tablet Take 200 mg by mouth every 6 (six) hours as needed (chest pain).   No facility-administered encounter medications on file as of 09/05/2022.    Allergies as of 09/05/2022 - Review Complete 09/05/2022  Allergen Reaction Noted   Guaifenesin er Nausea And Vomiting 04/07/2019   Pseudoephedrine Nausea And Vomiting 11/03/2020    Past Medical History:  Diagnosis Date   GERD (gastroesophageal reflux disease)     Past Surgical History:  Procedure Laterality Date   UPPER  GASTROINTESTINAL ENDOSCOPY     20 years ago    Family History  Problem Relation Age of Onset   Colon cancer Neg Hx    Esophageal cancer Neg Hx    Stomach cancer Neg Hx    Rectal cancer Neg Hx    Colon polyps Neg Hx     Social History   Socioeconomic History   Marital status: Married    Spouse name: Not on file   Number of children: Not on file   Years of education: Not on file   Highest education level: Not on file  Occupational History   Not on file  Tobacco Use   Smoking status: Former   Smokeless tobacco: Never  Vaping Use   Vaping Use: Never used  Substance and Sexual Activity   Alcohol use: No   Drug use: No   Sexual activity: Not on file  Other Topics Concern   Not on file  Social History Narrative   Not on file   Social Determinants of Health   Financial Resource Strain: Not on file  Food Insecurity: Not on file  Transportation Needs: Not on file  Physical Activity: Not on file  Stress: Not on file  Social Connections: Not on file  Intimate Partner Violence: Not on file    Review of Systems  Respiratory:  Negative for shortness of breath.   Psychiatric/Behavioral:  Positive for sleep disturbance.     Vitals:  09/05/22 1539  BP: 124/72  Pulse: 72  SpO2: 96%     Physical Exam Constitutional:      Appearance: Normal appearance.  HENT:     Head: Normocephalic.     Mouth/Throat:     Mouth: Mucous membranes are moist.  Eyes:     General: No scleral icterus.    Pupils: Pupils are equal, round, and reactive to light.  Cardiovascular:     Rate and Rhythm: Normal rate and regular rhythm.     Heart sounds: No murmur heard.    No friction rub.  Pulmonary:     Effort: No respiratory distress.     Breath sounds: No stridor. No wheezing or rhonchi.  Musculoskeletal:     Cervical back: No rigidity or tenderness.  Neurological:     Mental Status: She is alert.  Psychiatric:        Mood and Affect: Mood normal.       09/05/2022    3:00 PM   Results of the Epworth flowsheet  Sitting and reading 0  Watching TV 0  Sitting, inactive in a public place (e.g. a theatre or a meeting) 0  As a passenger in a car for an hour without a break 0  Lying down to rest in the afternoon when circumstances permit 2  Sitting and talking to someone 0  Sitting quietly after a lunch without alcohol 0  In a car, while stopped for a few minutes in traffic 0  Total score 2      Data Reviewed: No previous sleep study on record  Assessment:  Mild to moderate probability of significant obstructive sleep apnea  May also have just primary snoring  Pathophysiology of sleep disordered breathing discussed with the patient Treatment options discussed with the patient  Impact of weight gain on sleep disordered breathing discussed  Plan/Recommendations: Risks with not treating sleep disordered breathing discussed  Information material provided to patient regarding a sleep study and option of treatment  Tentative follow-up in about 6 months for further evaluation  Encouraged to call with significant concerns  Sleep study can be considered if patient decides that she wants to move ahead with it   Virl Diamond MD [facility] 09/05/2022, 3:46 PM  CC: Eartha Inch, MD

## 2022-12-10 ENCOUNTER — Telehealth: Payer: Self-pay | Admitting: Pulmonary Disease

## 2022-12-10 DIAGNOSIS — G478 Other sleep disorders: Secondary | ICD-10-CM

## 2022-12-10 DIAGNOSIS — G4733 Obstructive sleep apnea (adult) (pediatric): Secondary | ICD-10-CM

## 2022-12-10 NOTE — Telephone Encounter (Signed)
Patient was seen 09/05/22 by Dr. Wynona Neat looks like the patient was to let him know if she wanted to proceed with sleep study because no order was placed at that time

## 2022-12-10 NOTE — Telephone Encounter (Signed)
Pt calling in bc she was expecting to be sch for a HST and has not heard anything since, I have not seen any orders place for Pt HST pls advise

## 2022-12-12 NOTE — Telephone Encounter (Signed)
Called and spoke with patient. She stated that she is now interested in having the sleep study done. I advised her that I would send a message to AO to see which sleep study he wanted her to have. She verbalized understanding.   AO, can you please advise which sleep study you would like for her to have? Thanks!

## 2022-12-18 NOTE — Telephone Encounter (Signed)
Home sleep study placed. Called and made patient aware order had been placed.  Nothing further at this time.

## 2022-12-18 NOTE — Telephone Encounter (Signed)
Patient can be scheduled for a home sleep test

## 2023-01-03 ENCOUNTER — Telehealth: Payer: Self-pay | Admitting: Pulmonary Disease

## 2023-01-03 NOTE — Telephone Encounter (Signed)
PT has completed the HST and needs an appt before 8/30 due to no ins after that. No [place] or NP Appts avail. Please call to advise if NP willing to double book. Her # is [phone]

## 2023-01-09 NOTE — Telephone Encounter (Signed)
This is waiting to be read by Dr Wynona Neat its in snap but still waiting to be read

## 2023-01-09 NOTE — Telephone Encounter (Signed)
There are no sleep study results in the chart at this time. Will need to follow up with the PCCs to ensure we received this prior to making the appt. I'm happy to double book with a video visit (on a day that I am not already double booked) once we have these.

## 2023-01-09 NOTE — Telephone Encounter (Signed)
I don't mind double booking on a day where I am not already as well

## 2023-01-09 NOTE — Telephone Encounter (Signed)
I have openings today and yesterday not sure why there is showing APPs with no openings

## 2023-01-09 NOTE — Telephone Encounter (Signed)
HST results have not been read/scanned into epic.   Patient is aware of this information. Advised patient that a message would be sent to [facility] to try to determined if study has been read. Advised patient that we would contact her to schedule an appt after HST has been read. She voiced her understanding. She would like this visit prior to 8/31.  PCC's, can you ladies look in snap and see if study has been read? Thanks.

## 2023-01-11 ENCOUNTER — Encounter (INDEPENDENT_AMBULATORY_CARE_PROVIDER_SITE_OTHER): Payer: No Typology Code available for payment source

## 2023-01-11 ENCOUNTER — Telehealth: Payer: Self-pay | Admitting: Pulmonary Disease

## 2023-01-11 DIAGNOSIS — G4733 Obstructive sleep apnea (adult) (pediatric): Secondary | ICD-10-CM

## 2023-01-11 DIAGNOSIS — G478 Other sleep disorders: Secondary | ICD-10-CM

## 2023-01-11 NOTE — Telephone Encounter (Signed)
Call patient  Sleep study result  Date of study: 12/26/2022  Impression: Severe obstructive sleep apnea with mild oxygen desaturations  Recommendation: DME referral  Recommend CPAP therapy for moderate obstructive sleep apnea  Auto titrating CPAP with pressure settings of 5-15 will be appropriate  Encourage weight loss measures  Follow-up in the office 4 to 6 weeks following initiation of treatment

## 2023-01-14 NOTE — Telephone Encounter (Signed)
Sleep study has been read and scanned into Epic

## 2023-01-15 NOTE — Telephone Encounter (Signed)
Lm for patient to schedule appt. Sleep study has now been read.

## 2023-01-16 NOTE — Telephone Encounter (Signed)
Spoke with patient regarding sleep study result's   Call patient   Sleep study result   Date of study: 12/26/2022   Impression: Severe obstructive sleep apnea with mild oxygen desaturations   Recommendation: DME referral   Recommend CPAP therapy for moderate obstructive sleep apnea   Auto titrating CPAP with pressure settings of 5-15 will be appropriate   Encourage weight loss measures   Follow-up in the office 4 to 6 weeks following initiation of treatment     Patient stated she is changing insurance on 01/17/23 and is going to have a high deductible and is going to have to call back when the time is good to order a CPAP machine.   Patient's voice was understanding.Nothing else further needed.

## 2023-01-17 NOTE — Telephone Encounter (Signed)
Lm x2 for patient.  Will close encounter per office protocol.  Pending appt 10/15 with OA.  Nothing further neede.d

## 2023-01-21 ENCOUNTER — Telehealth: Payer: Self-pay | Admitting: Pulmonary Disease

## 2023-01-21 NOTE — Telephone Encounter (Signed)
I don't know of any options for no insurance [employer] used to have a program but that went away during covid not sure what to do

## 2023-01-21 NOTE — Telephone Encounter (Signed)
Pt has no insurance and wants to  go over her options fro getting a CPAP

## 2023-01-23 NOTE — Telephone Encounter (Signed)
Patient does have insurance  She has been advised to bring card by office to be scanned into chart.  She wanted to know what would be cheaper running thru ins or paying out of pocket.  Note closed pending patient bringing card in. She was advised to let front staff know what she is needing.

## 2023-03-04 ENCOUNTER — Ambulatory Visit: Payer: BLUE CROSS/BLUE SHIELD | Admitting: Pulmonary Disease

## 2023-06-04 ENCOUNTER — Ambulatory Visit: Payer: BLUE CROSS/BLUE SHIELD | Admitting: Pulmonary Disease

## 2023-06-23 ENCOUNTER — Telehealth: Payer: Self-pay | Admitting: Pulmonary Disease

## 2023-06-23 DIAGNOSIS — G4733 Obstructive sleep apnea (adult) (pediatric): Secondary | ICD-10-CM

## 2023-07-14 NOTE — Telephone Encounter (Signed)
 Patient would like to order a cpap machine and supplies. Please call and advise  [phone]

## 2023-07-15 NOTE — Telephone Encounter (Signed)
 Called the pt and there was no answer- LMTCB

## 2023-07-16 NOTE — Telephone Encounter (Signed)
 PT ret Leslie's call. Please try again. [phone].

## 2023-07-17 NOTE — Telephone Encounter (Signed)
 Spoke with the pt  She is asking for [place] to go ahead and order CPAP  She never started on this before due to insurance issue, but can start this now as she has insurance  She is scheduled for appt with Dr Wynona Neat 08/25/22 and is asking that we go ahead and order her machine  Please advise, thanks!

## 2023-07-21 NOTE — Telephone Encounter (Signed)
 Order has been placed. Pt is aware and voiced her understanding.  Pending appt for 4/7. Pt is aware that this appt may need to be pushed out depending on when she receives her machine.  Nothing further needed.

## 2023-07-21 NOTE — Telephone Encounter (Signed)
 Please place order for auto CPAP  Auto CPAP 5-15 with heated humidification with patient's mask of choice  Once set up with CPAP, needs follow-up within 30 to 90 days

## 2023-07-24 ENCOUNTER — Telehealth: Payer: Self-pay | Admitting: Pulmonary Disease

## 2023-07-24 NOTE — Telephone Encounter (Signed)
 OON with [facility]   Order sent to [facility] and confirmed by brad   Nothing further needed

## 2023-07-24 NOTE — Telephone Encounter (Signed)
 DME Company for cpap machine is not in the patient's insurance network. She will need a new DME company that will be covered by her insurance She has [employer] (not the medicaid but the regular plan). She can be reached at [phone]

## 2023-08-12 ENCOUNTER — Other Ambulatory Visit: Payer: Self-pay | Admitting: Family Medicine

## 2023-08-12 DIAGNOSIS — Z1231 Encounter for screening mammogram for malignant neoplasm of breast: Secondary | ICD-10-CM

## 2023-08-20 NOTE — Telephone Encounter (Signed)
 Left a VM about PT's insurance and the DME company we sent this to. We sent this to [employer] and I called to confirm with [employer] if everything is good with the order. They have confirmed there is no issues.

## 2023-08-20 NOTE — Telephone Encounter (Signed)
 Copied from CRM [id number]. Topic: Clinical - Request for Lab/Test Order >> Aug 19, 2023  3:01 PM Nila Nephew wrote: Reason for CRM: Patient is calling in once again to request somebody call her to provide her an update for her CPAP machine. Patient called in on 03/26 and message was sent. Message was completed with no documentation. Please call patient to advise on the progress or status of the CPAP machine acquisition.  Patient states that she will call [facility] as well, as it has been a month since she has heard anything at all, despite [employer] being out-of-network with her insurance on last check. >> Aug 19, 2023  3:17 PM Theodis Sato wrote: Patient states she contacted [facility] and was advised that there was no order sent there for her CPAP machine but that there was an account created. Patient states [facility] was not one of the DME companies in network with her insurance and would like to discuss this matter with a nurse.

## 2023-08-25 ENCOUNTER — Ambulatory Visit: Payer: BLUE CROSS/BLUE SHIELD | Admitting: Pulmonary Disease

## 2023-11-28 ENCOUNTER — Ambulatory Visit: Admitting: Pulmonary Disease
